# Patient Record
Sex: Female | Born: 2000
Health system: Southern US, Community
[De-identification: ages and names within clinical notes are randomized; demographics above are authoritative.]

## PROBLEM LIST (undated history)

## (undated) DIAGNOSIS — Z789 Other specified health status: Secondary | ICD-10-CM

## (undated) HISTORY — DX: Other specified health status: Z78.9

## (undated) HISTORY — PX: NO PAST SURGERIES: SHX2092

---
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
## 2018-09-09 ENCOUNTER — Other Ambulatory Visit: Payer: Self-pay | Admitting: Family Medicine

## 2018-09-09 ENCOUNTER — Other Ambulatory Visit (HOSPITAL_COMMUNITY)
Admission: RE | Admit: 2018-09-09 | Discharge: 2018-09-09 | Disposition: A | Discharge status: Home / Self Care | Payer: BLUE CROSS/BLUE SHIELD | Source: Ambulatory Visit | Attending: Family Medicine | Admitting: Family Medicine

## 2018-09-09 DIAGNOSIS — Z01411 Encounter for gynecological examination (general) (routine) with abnormal findings: Secondary | ICD-10-CM | POA: Insufficient documentation

## 2018-09-15 LAB — CYTOLOGY - PAP
Bacterial vaginitis: POSITIVE — AB
Candida vaginitis: NEGATIVE
Chlamydia: NEGATIVE
Diagnosis: NEGATIVE
Herpes: NEGATIVE
Neisseria Gonorrhea: NEGATIVE
Trichomonas: NEGATIVE

## 2020-07-01 ENCOUNTER — Ambulatory Visit (HOSPITAL_COMMUNITY)
Admission: EM | Admit: 2020-07-01 | Discharge: 2020-07-01 | Disposition: A | Discharge status: Home / Self Care | Payer: BC Managed Care – PPO | Attending: Family Medicine | Admitting: Family Medicine

## 2020-07-01 ENCOUNTER — Other Ambulatory Visit: Payer: Self-pay

## 2020-07-01 ENCOUNTER — Encounter (HOSPITAL_COMMUNITY): Payer: Self-pay

## 2020-07-01 DIAGNOSIS — Z3202 Encounter for pregnancy test, result negative: Secondary | ICD-10-CM

## 2020-07-01 DIAGNOSIS — R300 Dysuria: Secondary | ICD-10-CM | POA: Insufficient documentation

## 2020-07-01 LAB — POCT URINALYSIS DIP (DEVICE)
Bilirubin Urine: NEGATIVE
Glucose, UA: NEGATIVE mg/dL
Ketones, ur: NEGATIVE mg/dL
Nitrite: NEGATIVE
Protein, ur: NEGATIVE mg/dL
Specific Gravity, Urine: 1.02 (ref 1.005–1.030)
Urobilinogen, UA: 0.2 mg/dL (ref 0.0–1.0)
pH: 5.5 (ref 5.0–8.0)

## 2020-07-01 LAB — POC URINE PREG, ED: Preg Test, Ur: NEGATIVE

## 2020-07-01 NOTE — ED Provider Notes (Signed)
[facility]    CSN: [id number] Arrival date & time: 07/01/20  1530      History   Chief Complaint Chief Complaint  Patient presents with  . Dysuria    HPI Paige Sanchez is a 19 y.o. female.   Paige Sanchez presents with complaints of burning sensation with urination and frequency. States a few weeks ago she had some mild abdominal and back pain. This has since resolved but has been left with the burning sensation at the end of her stream. No blood in urine. Denies any vaginal symptoms. Currently menstruating. No fevers. No further back or abdominal symptoms. No gi symptoms. Denies any previous similar.    ROS per HPI, negative if not otherwise mentioned.      History reviewed. No pertinent past medical history.  There are no problems to display for this patient.   History reviewed. No pertinent surgical history.  OB History   No obstetric history on file.      Home Medications    Prior to Admission medications   Medication Sig Start Date End Date Taking? Authorizing Provider  amoxicillin (AMOXIL) 875 MG tablet Take 1 tablet (875 mg total) by mouth 2 (two) times daily. 12/22/14   [place], Arta Bruce, PA-C    Family History History reviewed. No pertinent family history.  Social History Social History   Tobacco Use  . Smoking status: Never Smoker  . Smokeless tobacco: Never Used  Substance Use Topics  . Alcohol use: No    Alcohol/week: 0.0 standard drinks  . Drug use: No     Allergies   Patient has no known allergies.   Review of Systems Review of Systems   Physical Exam Triage Vital Signs ED Triage Vitals  Enc Vitals Group     BP 07/01/20 1604 133/80     Pulse Rate 07/01/20 1604 71     Resp 07/01/20 1604 16     Temp 07/01/20 1604 98.7 F (37.1 C)     Temp Source 07/01/20 1604 Oral     SpO2 07/01/20 1604 97 %     Weight --      Height --      Head Circumference --      Peak Flow --      Pain Score 07/01/20 1606 0      Pain Loc --      Pain Edu? --      Excl. in GC? --    No data found.  Updated Vital Signs BP 133/80 (BP Location: Right Arm)   Pulse 71   Temp 98.7 F (37.1 C) (Oral)   Resp 16   LMP 07/01/2020 (Exact Date)   SpO2 97%    Physical Exam Constitutional:      General: She is not in acute distress.    Appearance: She is well-developed.  Cardiovascular:     Rate and Rhythm: Normal rate.  Pulmonary:     Effort: Pulmonary effort is normal.  Abdominal:     Palpations: Abdomen is soft. Abdomen is not rigid.     Tenderness: There is no abdominal tenderness. There is no right CVA tenderness, left CVA tenderness, guarding or rebound.  Genitourinary:    Comments: Denies sores, lesions, vaginal bleeding; no pelvic pain; gu exam deferred at this time, vaginal self swab collected.   Skin:    General: Skin is warm and dry.  Neurological:     Mental Status: She is alert and oriented to person, place, and time.  UC Treatments / Results  Labs (all labs ordered are listed, but only abnormal results are displayed) Labs Reviewed  POCT URINALYSIS DIP (DEVICE) - Abnormal; Notable for the following components:      Result Value   Hgb urine dipstick TRACE (*)    Leukocytes,Ua SMALL (*)    All other components within normal limits  URINE CULTURE  POC URINE PREG, ED  CERVICOVAGINAL ANCILLARY ONLY    EKG   Radiology No results found.  Procedures Procedures (including critical care time)  Medications Ordered in UC Medications - No data to display  Initial Impression / Assessment and Plan / UC Course  I have reviewed the triage vital signs and the nursing notes.  Pertinent labs & imaging results that were available during my care of the patient were reviewed by me and considered in my medical decision making (see chart for details).     Non toxic. Benign physical exam.  Urine with only small leuks and trace hgb- menstruating. Vaginal cytology collected and pending, urine  culture pending. Supportive cares recommended- limit caffeine, push fluids. Return precautions provided. Patient verbalized understanding and agreeable to plan.   Final Clinical Impressions(s) / UC Diagnoses   Final diagnoses:  Dysuria     Discharge Instructions     Your urine does not demonstrate an obvious urinary tract infection.  I have sent it to be cultured to confirm this.  We will also test the vagina to ensure no vaginal source of symptoms.  We will notify of you any positive findings or if any changes to treatment are needed. If normal or otherwise without concern to your results, we will not call you. Please log on to your MyChart to review your results if interested in so.   Increase your water intake.  If symptoms worsen or do not improve in the next week to return to be seen or to follow up with your PCP.     ED Prescriptions    None     PDMP not reviewed this encounter.   Georgetta Haber, NP 07/01/20 1705

## 2020-07-01 NOTE — ED Triage Notes (Signed)
Pt presents to UC for burning, and painful urination x1 week. Pt endorses lower abdominal and flank pain that is intermittent. Pt denies fever, n/v/d, chills, body aches. Pt has been treating at home with cranberry juice and azo with out relief.

## 2020-07-01 NOTE — Discharge Instructions (Signed)
Your urine does not demonstrate an obvious urinary tract infection.  I have sent it to be cultured to confirm this.  We will also test the vagina to ensure no vaginal source of symptoms.  We will notify of you any positive findings or if any changes to treatment are needed. If normal or otherwise without concern to your results, we will not call you. Please log on to your MyChart to review your results if interested in so.   Increase your water intake.  If symptoms worsen or do not improve in the next week to return to be seen or to follow up with your PCP.

## 2020-07-02 LAB — CERVICOVAGINAL ANCILLARY ONLY
Bacterial Vaginitis (gardnerella): NEGATIVE
Candida Glabrata: NEGATIVE
Candida Vaginitis: NEGATIVE
Chlamydia: NEGATIVE
Comment: NEGATIVE
Comment: NEGATIVE
Comment: NEGATIVE
Comment: NEGATIVE
Comment: NEGATIVE
Comment: NORMAL
Neisseria Gonorrhea: NEGATIVE
Trichomonas: NEGATIVE

## 2020-07-03 LAB — URINE CULTURE: Culture: >=100000 — AB

## 2020-07-04 ENCOUNTER — Telehealth (HOSPITAL_COMMUNITY): Payer: Self-pay | Admitting: Emergency Medicine

## 2020-07-04 MED ORDER — NITROFURANTOIN MONOHYD MACRO 100 MG PO CAPS
100.0000 mg | ORAL_CAPSULE | Freq: Two times a day (BID) | ORAL | 0 refills | Status: AC
Start: 2020-07-04 — End: 2020-07-09

## 2020-08-31 ENCOUNTER — Other Ambulatory Visit: Payer: Self-pay | Admitting: Critical Care Medicine

## 2020-08-31 ENCOUNTER — Other Ambulatory Visit: Payer: BC Managed Care – PPO

## 2020-08-31 DIAGNOSIS — Z20822 Contact with and (suspected) exposure to covid-19: Secondary | ICD-10-CM

## 2020-09-04 LAB — NOVEL CORONAVIRUS, NAA: SARS-CoV-2, NAA: NOT DETECTED

## 2021-08-20 ENCOUNTER — Ambulatory Visit (INDEPENDENT_AMBULATORY_CARE_PROVIDER_SITE_OTHER): Payer: BC Managed Care – PPO | Admitting: *Deleted

## 2021-08-20 ENCOUNTER — Other Ambulatory Visit: Payer: Self-pay

## 2021-08-20 VITALS — BP 124/79 | HR 81 | Temp 97.9°F | Ht 62.0 in | Wt 148.8 lb

## 2021-08-20 DIAGNOSIS — Z3400 Encounter for supervision of normal first pregnancy, unspecified trimester: Secondary | ICD-10-CM

## 2021-08-20 NOTE — Progress Notes (Signed)
   Location: [facility] Patient: clinic Provider: clinic (Sabri Teal, RN and [place], [place])  PRENATAL INTAKE SUMMARY  Ms. Paige Sanchez presents today New OB Nurse Interview.  OB History     Gravida  1   Para      Term      Preterm      AB      Living         SAB      IAB      Ectopic      Multiple      Live Births             I have reviewed the patient's medical, obstetrical, social, and family histories, medications, and available lab results.  SUBJECTIVE She has no unusual complaints.  OBJECTIVE Initial Nurse interview for history/labs (New OB)  EDD: 03/18/2022 GA: [redacted]w[redacted]d G1P0 FHT: 177  GENERAL APPEARANCE: alert, well appearing, in no apparent distress, oriented to person, place and time   ASSESSMENT Normal pregnancy  PLAN Prenatal care:  [facility] Pnl/HIV/Hep C OB Urine Culture GC/CT/PAP at next visit with Gerrit Heck, CNM HgbEval/SMA/CF (Horizon)-declined Panorama-declined A1C   Follow Up Instructions:   I discussed the assessment and treatment plan with the patient. The patient was provided an opportunity to ask questions and all were answered. The patient agreed with the plan and demonstrated an understanding of the instructions.   The patient was advised to call back or seek an in-person evaluation if the symptoms worsen or if the condition fails to improve as anticipated.  I provided 35 minutes of  face-to-face time during this encounter.  Clovis Pu, RN

## 2021-08-20 NOTE — Addendum Note (Signed)
Addended by: Clovis Pu on: 08/20/2021 11:15 AM   Modules accepted: Orders

## 2021-08-21 LAB — OBSTETRIC PANEL, INCLUDING HIV
Antibody Screen: NEGATIVE
Basophils Absolute: 0 10*3/uL (ref 0.0–0.2)
Basos: 0 %
EOS (ABSOLUTE): 0.1 10*3/uL (ref 0.0–0.4)
Eos: 1 %
HIV Screen 4th Generation wRfx: NONREACTIVE
Hematocrit: 36 % (ref 34.0–46.6)
Hemoglobin: 11.4 g/dL (ref 11.1–15.9)
Hepatitis B Surface Ag: NEGATIVE
Immature Grans (Abs): 0 10*3/uL (ref 0.0–0.1)
Immature Granulocytes: 1 %
Lymphocytes Absolute: 1.6 10*3/uL (ref 0.7–3.1)
Lymphs: 21 %
MCH: 27.3 pg (ref 26.6–33.0)
MCHC: 31.7 g/dL (ref 31.5–35.7)
MCV: 86 fL (ref 79–97)
Monocytes Absolute: 0.4 10*3/uL (ref 0.1–0.9)
Monocytes: 5 %
Neutrophils Absolute: 5.4 10*3/uL (ref 1.4–7.0)
Neutrophils: 72 %
Platelets: 283 10*3/uL (ref 150–450)
RBC: 4.18 x10E6/uL (ref 3.77–5.28)
RDW: 12.9 % (ref 11.7–15.4)
RPR Ser Ql: NONREACTIVE
Rh Factor: NEGATIVE
Rubella Antibodies, IGG: 1.89 index (ref >0.99)
WBC: 7.5 10*3/uL (ref 3.4–10.8)

## 2021-08-21 LAB — HEMOGLOBIN A1C
Est. average glucose Bld gHb Est-mCnc: 100 mg/dL
Hgb A1c MFr Bld: 5.1 % (ref 4.8–5.6)

## 2021-08-21 LAB — HEPATITIS C ANTIBODY: Hep C Virus Ab: <0.1 s/co ratio (ref 0.0–0.9)

## 2021-08-22 LAB — CULTURE, OB URINE

## 2021-08-22 LAB — URINE CULTURE, OB REFLEX: Organism ID, Bacteria: NO GROWTH

## 2021-08-30 DIAGNOSIS — O26899 Other specified pregnancy related conditions, unspecified trimester: Secondary | ICD-10-CM | POA: Insufficient documentation

## 2021-08-30 DIAGNOSIS — Z6791 Unspecified blood type, Rh negative: Secondary | ICD-10-CM | POA: Insufficient documentation

## 2021-09-26 LAB — HEPATITIS C ANTIBODY: HCV Ab: NEGATIVE

## 2021-09-27 ENCOUNTER — Encounter: Payer: BC Managed Care – PPO | Admitting: Certified Nurse Midwife

## 2021-09-30 LAB — OB RESULTS CONSOLE RUBELLA ANTIBODY, IGM: Rubella: IMMUNE

## 2021-10-01 LAB — OB RESULTS CONSOLE GC/CHLAMYDIA: Gonorrhea: NEGATIVE

## 2021-10-01 LAB — OB RESULTS CONSOLE HIV ANTIBODY (ROUTINE TESTING): HIV: NONREACTIVE

## 2021-10-06 ENCOUNTER — Other Ambulatory Visit: Payer: Self-pay

## 2021-10-06 ENCOUNTER — Inpatient Hospital Stay (HOSPITAL_COMMUNITY)
Admission: AD | Admit: 2021-10-06 | Discharge: 2021-10-06 | Disposition: A | Discharge status: Home / Self Care | Payer: BC Managed Care – PPO | Attending: Obstetrics & Gynecology | Admitting: Obstetrics & Gynecology

## 2021-10-06 DIAGNOSIS — Z3A16 16 weeks gestation of pregnancy: Secondary | ICD-10-CM | POA: Insufficient documentation

## 2021-10-06 DIAGNOSIS — O209 Hemorrhage in early pregnancy, unspecified: Secondary | ICD-10-CM | POA: Diagnosis present

## 2021-10-06 DIAGNOSIS — N930 Postcoital and contact bleeding: Secondary | ICD-10-CM

## 2021-10-06 DIAGNOSIS — O99891 Other specified diseases and conditions complicating pregnancy: Secondary | ICD-10-CM

## 2021-10-06 NOTE — MAU Note (Signed)
Pt reports to mau with c/o spotting since 0100 this morning that started after having intercourse last night.  Pt denies pain.  FHR 167

## 2021-10-06 NOTE — MAU Provider Note (Signed)
History     CSN: [id number]  Arrival date and time: 10/06/21 1322   Event Date/Time   First Provider Initiated Contact with Patient 10/06/21 1420      Chief Complaint  Patient presents with   Vaginal Bleeding   HPI Paige Sanchez is a 20 y.o. G1P0 at [redacted]w[redacted]d who presents with spotting after intercourse. She reports she is seeing a small amount of bright red bleeding when she wipes. She denies any pain or gushes of fluid. She reports no complications with the pregnancy before today.   OB History     Gravida  1   Para      Term      Preterm      AB      Living         SAB      IAB      Ectopic      Multiple      Live Births              Past Medical History:  Diagnosis Date   Medical history non-contributory     Past Surgical History:  Procedure Laterality Date   NO PAST SURGERIES      Family History  Problem Relation Age of Onset   Hypertension Father    Diabetes Paternal Grandmother     Social History   Tobacco Use   Smoking status: Never    Passive exposure: Never   Smokeless tobacco: Never  Vaping Use   Vaping Use: Never used  Substance Use Topics   Alcohol use: No    Comment: social   Drug use: No    Allergies: No Known Allergies  No medications prior to admission.    Review of Systems  Constitutional: Negative.  Negative for fatigue and fever.  HENT: Negative.    Respiratory: Negative.  Negative for shortness of breath.   Cardiovascular: Negative.  Negative for chest pain.  Gastrointestinal: Negative.  Negative for abdominal pain, constipation, diarrhea, nausea and vomiting.  Genitourinary:  Positive for vaginal bleeding. Negative for dysuria and vaginal discharge.  Neurological: Negative.  Negative for dizziness and headaches.  Physical Exam   Blood pressure 126/76, pulse 98, temperature 98.6 F (37 C), temperature source Oral, resp. rate 18, last menstrual period 06/11/2021, SpO2 100 %.  Physical Exam Vitals and  nursing note reviewed.  Constitutional:      General: She is not in acute distress.    Appearance: She is well-developed.  HENT:     Head: Normocephalic.  Eyes:     Pupils: Pupils are equal, round, and reactive to light.  Cardiovascular:     Rate and Rhythm: Normal rate and regular rhythm.     Heart sounds: Normal heart sounds.  Pulmonary:     Effort: Pulmonary effort is normal. No respiratory distress.     Breath sounds: Normal breath sounds.  Abdominal:     General: Bowel sounds are normal. There is no distension.     Palpations: Abdomen is soft.     Tenderness: There is no abdominal tenderness.  Genitourinary:    Comments: SSE: small amount of dark red blood in vault Skin:    General: Skin is warm and dry.  Neurological:     Mental Status: She is alert and oriented to person, place, and time.  Psychiatric:        Mood and Affect: Mood normal.        Behavior: Behavior normal.  Thought Content: Thought content normal.        Judgment: Judgment normal.   Cervix: closed/thick/posterior  FHT: 167 bpm  MAU Course  Procedures  MDM Fetal heart tones confirmed, minimal bleeding, cervix closed/thick Reassurance provided of normalcy of exam and normalcy of bleeding after intercourse. Discussed warning signs at length and  when to return to MAU  Assessment and Plan   1. PCB (post coital bleeding)   2. [redacted] weeks gestation of pregnancy    -Discharge home in stable condition -Bleeding precautions discussed -Patient advised to follow-up with OB as scheduled for prenatal care -Patient may return to MAU as needed or if her condition were to change or worsen   Rolm Bookbinder CNM 10/06/2021, 2:20 PM

## 2021-10-06 NOTE — Discharge Instructions (Signed)

## 2021-10-22 ENCOUNTER — Ambulatory Visit: Payer: BC Managed Care – PPO

## 2021-12-22 NOTE — L&D Delivery Note (Addendum)
Delivery Note ? ? ?Patient Name: Paige Sanchez ?DOB: 03/19/2001 ?MRN: [id number] ? ?Date of admission: 03/07/2022 ?Delivering MD: Oliver Hum, Katelan Hirt  ?Date of delivery: 03/08/22 ?Type of delivery: SVD ? ?Newborn Data: ?Live born female  ?Birth Weight:   ?APGAR: 9, 10 ? ?Newborn Delivery   ?Birth date/time: 03/08/2022 16:02:00 ?Delivery type: Vaginal, Spontaneous ?  ?  ?Paige Sanchez, 21 y.o., @ [redacted]w[redacted]d,  G1P0, who was admitted for SROM in early labor, progress with pitocin and SROM>24 hours. I was called to the room when she progressed 2+ station in the second stage of labor.  She pushed for 20/min.  She delivered a viable infant, cephalic and restituted to the ROA position over an intact perineum.  A loose nuchal cord   was identified, and reduced. The baby was placed on maternal abdomen while initial step of NRP were perfmored (Dry, Stimulated, and warmed). Hat placed on baby for thermoregulation. Delayed cord clamping was performed for 3 minutes.  Cord double clamped and cut.  Cord cut by FOB. Apgar scores were 9 and 10. Prophylactic Pitocin was started in the third stage of labor for active management. The placenta delivered spontaneously, shultz, with a 3 vessel cord and was sent to LD.  Inspection revealed 2nd degree and right labial. An examination of the vaginal vault and cervix was free from lacerations. The uterus was firm, bleeding stable.  The repair was done under epidural and lidocaine.   Placenta and umbilical artery blood gas were not sent.  There were no complications during the procedure.  Mom and baby skin to skin following delivery. Left in stable condition. Pt denies HA, RUQ pain or vision changes. Will recheck CBC and CMP in the morning. Started on procardia 30mg  XL daily,. Will increase tomorrow if BP still elevated.  ?BP (!) 146/80   Pulse (!) 108   Temp 98.1 ?F (36.7 ?C) (Oral)   Resp 18   Ht 5\' 2"  (1.575 m)   Wt 84.4 kg   LMP 06/11/2021   SpO2 100%   BMI 34.02 kg/m?  ? ? ?Maternal  Info: ?Anesthesia: Epidural ?Episiotomy: no ?Lacerations:  2nd degree repaired and right labial not repaired: hemostatic ?Suture Repair: 3.0 CT vicryl   ?Est. Blood Loss (mL):  ? ?Newborn Info: ? ?Baby Sex: female ?Circumcision: in pt desired ?APGAR (1 MIN): 9   ?APGAR (5 MINS): 10   ?APGAR (10 MINS):   ? ? ?Mom to postpartum.  Baby to Couplet care / Skin to Skin. ? ?DR 06/13/2021 on birth  ? ?[facility], Renato Battles, NP-C ?03/08/22 ?4:46 PM ? ? ? ?  ?

## 2022-02-24 LAB — OB RESULTS CONSOLE GBS: GBS: NEGATIVE

## 2022-03-07 ENCOUNTER — Inpatient Hospital Stay (HOSPITAL_COMMUNITY)
Admission: AD | Admit: 2022-03-07 | Discharge: 2022-03-10 | DRG: 807 | Disposition: A | Discharge status: Home / Self Care | Payer: BC Managed Care – PPO | Attending: Obstetrics and Gynecology | Admitting: Obstetrics and Gynecology

## 2022-03-07 ENCOUNTER — Other Ambulatory Visit: Payer: Self-pay

## 2022-03-07 ENCOUNTER — Encounter (HOSPITAL_COMMUNITY): Payer: Self-pay | Admitting: Obstetrics and Gynecology

## 2022-03-07 DIAGNOSIS — O26899 Other specified pregnancy related conditions, unspecified trimester: Secondary | ICD-10-CM

## 2022-03-07 DIAGNOSIS — Z6791 Unspecified blood type, Rh negative: Secondary | ICD-10-CM | POA: Diagnosis not present

## 2022-03-07 DIAGNOSIS — Z3A38 38 weeks gestation of pregnancy: Secondary | ICD-10-CM | POA: Diagnosis not present

## 2022-03-07 DIAGNOSIS — O134 Gestational [pregnancy-induced] hypertension without significant proteinuria, complicating childbirth: Secondary | ICD-10-CM | POA: Diagnosis present

## 2022-03-07 DIAGNOSIS — O139 Gestational [pregnancy-induced] hypertension without significant proteinuria, unspecified trimester: Secondary | ICD-10-CM | POA: Diagnosis not present

## 2022-03-07 DIAGNOSIS — O4292 Full-term premature rupture of membranes, unspecified as to length of time between rupture and onset of labor: Principal | ICD-10-CM | POA: Diagnosis present

## 2022-03-07 DIAGNOSIS — Z3400 Encounter for supervision of normal first pregnancy, unspecified trimester: Principal | ICD-10-CM

## 2022-03-07 DIAGNOSIS — O26893 Other specified pregnancy related conditions, third trimester: Secondary | ICD-10-CM | POA: Diagnosis present

## 2022-03-07 LAB — COMPREHENSIVE METABOLIC PANEL
ALT: 12 U/L (ref 0–44)
AST: 25 U/L (ref 15–41)
Albumin: 3.4 g/dL — ABNORMAL LOW (ref 3.5–5.0)
Alkaline Phosphatase: 167 U/L — ABNORMAL HIGH (ref 38–126)
Anion gap: 10 (ref 5–15)
BUN: 5 mg/dL — ABNORMAL LOW (ref 6–20)
CO2: 19 mmol/L — ABNORMAL LOW (ref 22–32)
Calcium: 9.4 mg/dL (ref 8.9–10.3)
Chloride: 107 mmol/L (ref 98–111)
Creatinine, Ser: 0.5 mg/dL (ref 0.44–1.00)
GFR, Estimated: >60 mL/min (ref >60)
Glucose, Bld: 66 mg/dL — ABNORMAL LOW (ref 70–99)
Potassium: 4.3 mmol/L (ref 3.5–5.1)
Sodium: 136 mmol/L (ref 135–145)
Total Bilirubin: 0.8 mg/dL (ref 0.3–1.2)
Total Protein: 6.9 g/dL (ref 6.5–8.1)

## 2022-03-07 LAB — CBC
HCT: 36.6 % (ref 36.0–46.0)
Hemoglobin: 11.6 g/dL — ABNORMAL LOW (ref 12.0–15.0)
MCH: 28.1 pg (ref 26.0–34.0)
MCHC: 31.7 g/dL (ref 30.0–36.0)
MCV: 88.6 fL (ref 80.0–100.0)
Platelets: 237 10*3/uL (ref 150–400)
RBC: 4.13 MIL/uL (ref 3.87–5.11)
RDW: 15 % (ref 11.5–15.5)
WBC: 10.9 10*3/uL — ABNORMAL HIGH (ref 4.0–10.5)
nRBC: 0 % (ref 0.0–0.2)

## 2022-03-07 LAB — TYPE AND SCREEN
ABO/RH(D): O NEG
Antibody Screen: POSITIVE

## 2022-03-07 LAB — PROTEIN / CREATININE RATIO, URINE
Creatinine, Urine: 72.33 mg/dL
Protein Creatinine Ratio: 0.24 mg/mg{Cre} — ABNORMAL HIGH (ref 0.00–0.15)
Total Protein, Urine: 17 mg/dL

## 2022-03-07 LAB — POCT FERN TEST: POCT Fern Test: POSITIVE

## 2022-03-07 MED ORDER — OXYCODONE-ACETAMINOPHEN 5-325 MG PO TABS: 2.0000 | ORAL_TABLET | ORAL | Status: DC | PRN

## 2022-03-07 MED ORDER — OXYTOCIN-SODIUM CHLORIDE 30-0.9 UT/500ML-% IV SOLN
2.5000 [IU]/h | INTRAVENOUS | Status: DC
  Administered 2022-03-08: 2.5 [IU]/h via INTRAVENOUS

## 2022-03-07 MED ORDER — SOD CITRATE-CITRIC ACID 500-334 MG/5ML PO SOLN: 30.0000 mL | ORAL | Status: DC | PRN

## 2022-03-07 MED ORDER — LACTATED RINGERS IV SOLN: INTRAVENOUS | Status: DC

## 2022-03-07 MED ORDER — OXYTOCIN-SODIUM CHLORIDE 30-0.9 UT/500ML-% IV SOLN
1.0000 m[IU]/min | INTRAVENOUS | Status: DC
  Administered 2022-03-08: 2 m[IU]/min via INTRAVENOUS
  Filled 2022-03-07: qty 500

## 2022-03-07 MED ORDER — OXYCODONE-ACETAMINOPHEN 5-325 MG PO TABS: 1.0000 | ORAL_TABLET | ORAL | Status: DC | PRN

## 2022-03-07 MED ORDER — LACTATED RINGERS IV SOLN
500.0000 mL | INTRAVENOUS | Status: DC | PRN
  Administered 2022-03-08: 500 mL via INTRAVENOUS

## 2022-03-07 MED ORDER — ONDANSETRON HCL 4 MG/2ML IJ SOLN: 4.0000 mg | Freq: Four times a day (QID) | INTRAMUSCULAR | Status: DC | PRN

## 2022-03-07 MED ORDER — LIDOCAINE HCL (PF) 1 % IJ SOLN
30.0000 mL | INTRAMUSCULAR | Status: AC | PRN
  Administered 2022-03-08: 30 mL via SUBCUTANEOUS
  Filled 2022-03-07: qty 30

## 2022-03-07 MED ORDER — TERBUTALINE SULFATE 1 MG/ML IJ SOLN: 0.2500 mg | Freq: Once | INTRAMUSCULAR | Status: DC | PRN

## 2022-03-07 MED ORDER — FENTANYL CITRATE (PF) 100 MCG/2ML IJ SOLN
50.0000 ug | INTRAMUSCULAR | Status: DC | PRN
  Administered 2022-03-08 (×2): 50 ug via INTRAVENOUS
  Filled 2022-03-07 (×2): qty 2

## 2022-03-07 MED ORDER — ACETAMINOPHEN 325 MG PO TABS: 650.0000 mg | ORAL_TABLET | ORAL | Status: DC | PRN

## 2022-03-07 MED ORDER — OXYTOCIN BOLUS FROM INFUSION
333.0000 mL | Freq: Once | INTRAVENOUS | Status: AC
  Administered 2022-03-08: 333 mL via INTRAVENOUS

## 2022-03-07 NOTE — Progress Notes (Addendum)
Labor Progress Note ? ?Paige Sanchez is a 21 y.o. female, G1P0000, IUP at 38.3 weeks, presenting for Early labor with SROM @1330  on 3/17. RH-, rhogam given 12/27/2021. Pt endorse + FM. Denies vaginal bleeding.  ? ?Subjective: ?Pt resting well, still feeling cxt. Tolerates well, support at bedside. POC reviewed pt desires expectant management, but willing to start pitocin at MN if no cervical change. Reviewed R/B/A of pitocin.  ?Patient Active Problem List  ? Diagnosis Date Noted  ? Normal labor and delivery 03/07/2022  ? Rh negative, antepartum 08/30/2021  ? Supervision of normal first pregnancy, antepartum 08/20/2021  ? ?Objective: ?BP 138/78   Pulse (!) 105   Temp 98.7 ?F (37.1 ?C)   Resp 18   LMP 06/11/2021  ?No intake/output data recorded. ?No intake/output data recorded. ?NST: FHR baseline 145 bpm, Variability: moderate, Accelerations:present, Decelerations:  Absent= Cat 1/Reactive ?CTX:  irregular, every 2-4 minutes, lasting 70-80 seconds. ?Uterus gravid, soft non tender, moderate to palpate with contractions.  ?SVE:  Dilation: 2 ?Effacement (%): 80 ?Station: -2 ?Exam by:: Aron Baba RN ?Pitocin at 0 mUn/min ? ?Assessment:  ?Paige Sanchez is a 21 y.o. female, G1P0000, IUP at 38.3 weeks, presenting for Early labor with SROM @1330  on 3/17. RH-, rhogam given 12/27/2021. Pt endorse + FM. Denies vaginal bleeding. Progressing naturally in latent labor.  ?Patient Active Problem List  ? Diagnosis Date Noted  ? Normal labor and delivery 03/07/2022  ? Rh negative, antepartum 08/30/2021  ? Supervision of normal first pregnancy, antepartum 08/20/2021  ? ?NICHD: Category 1 ? ?Membranes:  SROM, clear @ 1330 on 3/17, no s/s of infection ? ?Induction:   ? Cytotec xN/A, pt 2cm and 80% upon admission  ? Foley Bulb: N/A pt SROM ? Pitocin - 0 ? ?Pain management:   ?            IV pain management: x PRN fentanyl ? Nitrous:  PRN ?            Epidural placement: PRN ? ?GBS Negative ? ? ?Plan: ?Continue labor  plan ?Continuous/intermittent monitoring ?Rest ?Ambulate ?Frequent position changes to facilitate fetal rotation and descent. ?Will reassess with cervical exam at 4 hours or earlier if necessary ?Expectant management.  ?Plan to start pitocin per protocol if labor does not progress ?Anticipate labor progression and vaginal delivery.  ? ?Noralyn Pick, NP-C, CNM, MSN ?03/07/2022. 8:30 PM  ?

## 2022-03-07 NOTE — H&P (Signed)
Paige Sanchez is a 21 y.o. female, G1P0000, IUP at 38.3 weeks, presenting for Early labor with SROM @1330  on 3/17. RH-, rhogam given 12/27/2021. Pt endorse + FM. Denies vaginal bleeding.  ? ?Patient Active Problem List  ? Diagnosis Date Noted  ? Normal labor and delivery 03/07/2022  ? Rh negative, antepartum 08/30/2021  ? Supervision of normal first pregnancy, antepartum 08/20/2021  ?  ? ?Active Ambulatory Problems  ?  Diagnosis Date Noted  ? Supervision of normal first pregnancy, antepartum 08/20/2021  ? Rh negative, antepartum 08/30/2021  ? ?Resolved Ambulatory Problems  ?  Diagnosis Date Noted  ? No Resolved Ambulatory Problems  ? ?Past Medical History:  ?Diagnosis Date  ? Medical history non-contributory   ?  ? ? ?Medications Prior to Admission  ?Medication Sig Dispense Refill Last Dose  ? calcium-vitamin D (OSCAL WITH D) 500-5 MG-MCG tablet Take 1 tablet by mouth.   03/06/2022  ? ferrous sulfate 325 (65 FE) MG tablet Take 325 mg by mouth daily with breakfast.   03/06/2022  ? Prenatal Vit-Fe Fumarate-FA (PRENATAL MULTIVITAMIN) TABS tablet Take 1 tablet by mouth daily at 12 noon.   03/06/2022  ? ? ?Past Medical History:  ?Diagnosis Date  ? Medical history non-contributory   ?  ? ?No current facility-administered medications on file prior to encounter.  ? ?Current Outpatient Medications on File Prior to Encounter  ?Medication Sig Dispense Refill  ? calcium-vitamin D (OSCAL WITH D) 500-5 MG-MCG tablet Take 1 tablet by mouth.    ? ferrous sulfate 325 (65 FE) MG tablet Take 325 mg by mouth daily with breakfast.    ? Prenatal Vit-Fe Fumarate-FA (PRENATAL MULTIVITAMIN) TABS tablet Take 1 tablet by mouth daily at 12 noon.    ?  ? ?No Known Allergies ? ?History of present pregnancy: ?Pt Info/Preference:  Screening/Consents:  Labs:   ?EDD: Estimated Date of Delivery: 03/18/22  ?Establised: Patient's last menstrual period was 06/11/2021.  Anatomy Scan: Date: 11/30 ?Placenta Location: anterior and complete Genetic Screen:  [place]:LR ?AFP: Neg ?First Tri: ?Quad: ?Horizon: Neg  ?Office: [facility]            First PNV: 11.1 Blood Type --/--/O NEG (03/17 1615)  ?Language:  english Last PNV: 37.6 weeks Rhogam    ?Flu Vaccine:  UTD   Antibody PENDING (03/17 1615)  ?TDaP vaccine UTD   GTT: Early: 5.0 ?Third Trimester: 15  ?Feeding Plan: Breast BTL: no Rubella: 1.89 (08/30 0913)  ?Contraception: ??? VBAC: no RPR: Non Reactive (08/30 0913)   ?Circumcision: ???   HBsAg: Negative (08/30 0913)/HCV negative   ?Pediatrician:  ???   HIV: Non Reactive (08/30 0913)   ?Prenatal Classes: no Additional 06-13-2001: no GBS: Negative/-- (03/06 0000)(For PCN allergy, check sensitivities)   ?    Chlamydia: neg  ?  MFM Referral/Consult:  GC: neg  ?Support Person: partner   PAP: ????  ?Pain Management: Epidural vs natural Neonatologist Referral:  Hgb Electrophoresis:  AA  ?Birth Plan: [facility]   Hgb NOB: 11.5    28W: 10.3  ? ?OB History   ? ? Gravida  ?1  ? Para  ?   ? Term  ?   ? Preterm  ?   ? AB  ?   ? Living  ?   ?  ? ? SAB  ?   ? IAB  ?   ? Ectopic  ?   ? Multiple  ?   ? Live Births  ?   ?   ?  ?  ? ?  Past Medical History:  ?Diagnosis Date  ? Medical history non-contributory   ? ?Past Surgical History:  ?Procedure Laterality Date  ? NO PAST SURGERIES    ? ?Family History: family history includes Diabetes in her paternal grandmother; Hypertension in her father. ?Social History:  reports that she has never smoked. She has never been exposed to tobacco smoke. She has never used smokeless tobacco. She reports that she does not drink alcohol and does not use drugs. ? ? ?Prenatal Transfer Tool  ?Maternal Diabetes: No ?Genetic Screening: Normal ?Maternal Ultrasounds/Referrals: Normal ?Fetal Ultrasounds or other Referrals:  None ?Maternal Substance Abuse:  No ?Significant Maternal Medications:  None ?Significant Maternal Lab Results: Group B Strep negative ? ?ROS:  ?Review of Systems  ?Constitutional: Negative.   ?HENT: Negative.    ?Eyes: Negative.   ?Respiratory: Negative.     ?Cardiovascular: Negative.   ?Gastrointestinal: Negative.   ?Genitourinary: Negative.   ?     Vaginal leakage  ?Musculoskeletal: Negative.   ?Skin: Negative.   ?Neurological: Negative.   ?Endo/Heme/Allergies: Negative.   ?Psychiatric/Behavioral: Negative.     ? ?Physical Exam: ?BP 138/78   Pulse (!) 105   Temp 98.7 ?F (37.1 ?C)   Resp 18   LMP 06/11/2021   ?Physical Exam ?Vitals and nursing note reviewed.  ?Constitutional:   ?   Appearance: Normal appearance.  ?HENT:  ?   Head: Normocephalic and atraumatic.  ?   Nose: Nose normal.  ?   Mouth/Throat:  ?   Mouth: Mucous membranes are moist.  ?Eyes:  ?   Conjunctiva/sclera: Conjunctivae normal.  ?Cardiovascular:  ?   Rate and Rhythm: Normal rate and regular rhythm.  ?   Pulses: Normal pulses.  ?   Heart sounds: Normal heart sounds.  ?Pulmonary:  ?   Effort: Pulmonary effort is normal.  ?   Breath sounds: Normal breath sounds.  ?Abdominal:  ?   General: Bowel sounds are normal.  ?Genitourinary: ?   Comments: Uteurs gravida equal to dates, pelvis adequate for vaginal delivery.  ?Musculoskeletal:     ?   General: Normal range of motion.  ?   Cervical back: Normal range of motion and neck supple.  ?Skin: ?   General: Skin is warm.  ?   Capillary Refill: Capillary refill takes less than 2 seconds.  ?Neurological:  ?   General: No focal deficit present.  ?   Mental Status: She is alert.  ?Psychiatric:     ?   Mood and Affect: Mood normal.  ?  ? ?NST: FHR baseline 145 bpm, Variability: moderate, Accelerations:present, Decelerations:  Absent= Cat 1/Reactive ?UC:   irregular, every 2-5 minutes, lasting 70-80 seconds long.  ?SVE:   Dilation: 2 ?Effacement (%): 80 ?Station: -2 ?Exam by:: S moyer RN, vertex verified by fetal sutures.  ?Leopold's: Position vertex, EFW 7lbs via leopold's.  ?Bedside US vertex in MAU.  ? ?Labs: ?Results for orders placed or performed during the hospital encounter of 03/07/22 (from the past 24 hour(s))  ?POCT fern test     Status: Abnormal  ?  Collection Time: 03/07/22  3:46 PM  ?Result Value Ref Range  ? POCT Fern Test Positive = ruptured amniotic membanes   ?CBC     Status: Abnormal  ? Collection Time: 03/07/22  4:15 PM  ?Result Value Ref Range  ? WBC 10.9 (H) 4.0 - 10.5 K/uL  ? RBC 4.13 3.87 - 5.11 MIL/uL  ? Hemoglobin 11.6 (L) 12.0 - 15.0 g/dL  ? HCT  36.6 36.0 - 46.0 %  ? MCV 88.6 80.0 - 100.0 fL  ? MCH 28.1 26.0 - 34.0 pg  ? MCHC 31.7 30.0 - 36.0 g/dL  ? RDW 15.0 11.5 - 15.5 %  ? Platelets 237 150 - 400 K/uL  ? nRBC 0.0 0.0 - 0.2 %  ?Type and screen [facility]     Status: None (Preliminary result)  ? Collection Time: 03/07/22  4:15 PM  ?Result Value Ref Range  ? ABO/RH(D) O NEG   ? Antibody Screen PENDING   ? Sample Expiration    ?  03/10/2022,2359 ?Performed at [facility], [address], [place], [place] [address] ?  ? ? ?Imaging:  ?No results found. ? ?MAU Course: ?Orders Placed This Encounter  ?Procedures  ? OB RESULT CONSOLE Group B Strep  ? CBC  ? RPR  ? Diet clear liquid Room service appropriate? Yes; Fluid consistency: Thin  ? Contraction - monitoring  ? External fetal heart monitoring  ? Vaginal exam  ? Vitals signs per unit policy  ? Notify physician (specify)  ? Fetal monitoring per unit policy  ? Activity as tolerated  ? Cervical Exam  ? Measure blood pressure post delivery every 15 min x 1 hour then every 30 min x 1 hour  ? Fundal check post delivery every 15 min x 1 hour then every 30 min x 1 hour  ? If Rapid HIV test positive or known HIV positive: initiate AZT orders  ? May in and out cath x 2 for inability to void  ? Insert urethral catheter X 1 PRN If Coude Catheter is chosen, qualified resources by campus can be found in the clinical skills nursing procedure for Coude Catheter 1. If straight catheterized > 2 times or patient unable to void post epidural plac...  ? Refer to Sidebar Report Urinary (Foley) Catheter Indications  ? Refer to Sidebar Report Post Indwelling Urinary Catheter Removal and  Intervention Guidelines  ? Discontinue foley prior to vaginal delivery  ? Initiate Carrier Fluid Protocol  ? Initiate Oral Care Protocol  ? Patient may have epidural placement upon request  ? Full code  ? Nitrous Oxide 50%/Oxyge

## 2022-03-07 NOTE — Progress Notes (Signed)
Pt informed that the ultrasound is considered a limited OB ultrasound and is not intended to be a complete ultrasound exam.  Patient also informed that the ultrasound is not being completed with the intent of assessing for fetal or placental anomalies or any pelvic abnormalities.  Explained that the purpose of today?s ultrasound is to assess for  presentation and viability.  Patient acknowledges the purpose of the exam and the limitations of the study.    ? ?Presentation: VERTEX  ? ?Paige Clan, DO  ?

## 2022-03-07 NOTE — MAU Note (Signed)
.  Paige Sanchez is a 22 y.o. at [redacted]w[redacted]d here in MAU reporting: that around 1:30 she had a gush of fluid and has continued to leak sine. Good fetal movement felt and feeling some pelvic pressure and tightening  ? ?Onset of complaint: 1330 ?Pain score: 2/10 ?Vitals:  ? 03/07/22 1523  ?BP: 138/74  ?Pulse: 88  ?Resp: 18  ?Temp: 98.7 ?F (37.1 ?C)  ?   ?FHT:155 ?Lab orders placed from triage:  . ?

## 2022-03-08 ENCOUNTER — Inpatient Hospital Stay (HOSPITAL_COMMUNITY): Payer: BC Managed Care – PPO | Admitting: Anesthesiology

## 2022-03-08 ENCOUNTER — Encounter (HOSPITAL_COMMUNITY): Payer: Self-pay | Admitting: Obstetrics and Gynecology

## 2022-03-08 DIAGNOSIS — O139 Gestational [pregnancy-induced] hypertension without significant proteinuria, unspecified trimester: Secondary | ICD-10-CM | POA: Diagnosis not present

## 2022-03-08 LAB — RPR: RPR Ser Ql: NONREACTIVE

## 2022-03-08 MED ORDER — DIBUCAINE (PERIANAL) 1 % EX OINT: 1.0000 "application " | TOPICAL_OINTMENT | CUTANEOUS | Status: DC | PRN

## 2022-03-08 MED ORDER — BENZOCAINE-MENTHOL 20-0.5 % EX AERO: 1.0000 "application " | INHALATION_SPRAY | CUTANEOUS | Status: DC | PRN

## 2022-03-08 MED ORDER — COCONUT OIL OIL: 1.0000 "application " | TOPICAL_OIL | Status: DC | PRN

## 2022-03-08 MED ORDER — IBUPROFEN 600 MG PO TABS
600.0000 mg | ORAL_TABLET | Freq: Four times a day (QID) | ORAL | Status: DC
  Administered 2022-03-08 – 2022-03-10 (×7): 600 mg via ORAL
  Filled 2022-03-08 (×7): qty 1

## 2022-03-08 MED ORDER — SIMETHICONE 80 MG PO CHEW: 80.0000 mg | CHEWABLE_TABLET | ORAL | Status: DC | PRN

## 2022-03-08 MED ORDER — LACTATED RINGERS IV SOLN
500.0000 mL | Freq: Once | INTRAVENOUS | Status: AC
  Administered 2022-03-08: 500 mL via INTRAVENOUS

## 2022-03-08 MED ORDER — ONDANSETRON HCL 4 MG/2ML IJ SOLN: 4.0000 mg | INTRAMUSCULAR | Status: DC | PRN

## 2022-03-08 MED ORDER — ZOLPIDEM TARTRATE 5 MG PO TABS: 5.0000 mg | ORAL_TABLET | Freq: Every evening | ORAL | Status: DC | PRN

## 2022-03-08 MED ORDER — PRENATAL MULTIVITAMIN CH
1.0000 | ORAL_TABLET | Freq: Every day | ORAL | Status: DC
  Administered 2022-03-09 – 2022-03-10 (×2): 1 via ORAL
  Filled 2022-03-08 (×2): qty 1

## 2022-03-08 MED ORDER — WITCH HAZEL-GLYCERIN EX PADS: 1.0000 "application " | MEDICATED_PAD | CUTANEOUS | Status: DC | PRN

## 2022-03-08 MED ORDER — TETANUS-DIPHTH-ACELL PERTUSSIS 5-2.5-18.5 LF-MCG/0.5 IM SUSY: 0.5000 mL | PREFILLED_SYRINGE | Freq: Once | INTRAMUSCULAR | Status: DC

## 2022-03-08 MED ORDER — PHENYLEPHRINE 40 MCG/ML (10ML) SYRINGE FOR IV PUSH (FOR BLOOD PRESSURE SUPPORT): 80.0000 ug | PREFILLED_SYRINGE | INTRAVENOUS | Status: DC | PRN

## 2022-03-08 MED ORDER — PHENYLEPHRINE 40 MCG/ML (10ML) SYRINGE FOR IV PUSH (FOR BLOOD PRESSURE SUPPORT)
80.0000 ug | PREFILLED_SYRINGE | INTRAVENOUS | Status: DC | PRN
  Filled 2022-03-08: qty 10

## 2022-03-08 MED ORDER — ACETAMINOPHEN 325 MG PO TABS: 650.0000 mg | ORAL_TABLET | ORAL | Status: DC | PRN

## 2022-03-08 MED ORDER — LIDOCAINE HCL (PF) 1 % IJ SOLN
INTRAMUSCULAR | Status: DC | PRN
  Administered 2022-03-08: 4 mL via EPIDURAL
  Administered 2022-03-08: 6 mL via EPIDURAL

## 2022-03-08 MED ORDER — EPHEDRINE 5 MG/ML INJ: 10.0000 mg | INTRAVENOUS | Status: DC | PRN

## 2022-03-08 MED ORDER — NIFEDIPINE ER OSMOTIC RELEASE 30 MG PO TB24
30.0000 mg | ORAL_TABLET | Freq: Every day | ORAL | Status: DC
  Administered 2022-03-08 – 2022-03-10 (×3): 30 mg via ORAL
  Filled 2022-03-08 (×3): qty 1

## 2022-03-08 MED ORDER — DIPHENHYDRAMINE HCL 25 MG PO CAPS: 25.0000 mg | ORAL_CAPSULE | Freq: Four times a day (QID) | ORAL | Status: DC | PRN

## 2022-03-08 MED ORDER — ONDANSETRON HCL 4 MG PO TABS: 4.0000 mg | ORAL_TABLET | ORAL | Status: DC | PRN

## 2022-03-08 MED ORDER — FENTANYL-BUPIVACAINE-NACL 0.5-0.125-0.9 MG/250ML-% EP SOLN
12.0000 mL/h | EPIDURAL | Status: DC | PRN
  Administered 2022-03-08: 12 mL/h via EPIDURAL
  Filled 2022-03-08: qty 250

## 2022-03-08 MED ORDER — RHO D IMMUNE GLOBULIN 1500 UNIT/2ML IJ SOSY
300.0000 ug | PREFILLED_SYRINGE | Freq: Once | INTRAMUSCULAR | Status: AC
  Administered 2022-03-09: 300 ug via INTRAVENOUS
  Filled 2022-03-08: qty 2

## 2022-03-08 MED ORDER — SENNOSIDES-DOCUSATE SODIUM 8.6-50 MG PO TABS
2.0000 | ORAL_TABLET | Freq: Every day | ORAL | Status: DC
  Administered 2022-03-09 – 2022-03-10 (×2): 2 via ORAL
  Filled 2022-03-08 (×2): qty 2

## 2022-03-08 MED ORDER — DIPHENHYDRAMINE HCL 50 MG/ML IJ SOLN: 12.5000 mg | INTRAMUSCULAR | Status: DC | PRN

## 2022-03-08 NOTE — Anesthesia Preprocedure Evaluation (Signed)
Anesthesia Evaluation  Patient identified by MRN, date of birth, ID band Patient awake    Reviewed: Allergy & Precautions, H&P , NPO status , Patient's Chart, lab work & pertinent test results  History of Anesthesia Complications Negative for: history of anesthetic complications  Airway Mallampati: II  TM Distance: >3 FB     Dental   Pulmonary neg pulmonary ROS,    Pulmonary exam normal        Cardiovascular hypertension (PIH),  Rhythm:regular Rate:Normal     Neuro/Psych negative neurological ROS  negative psych ROS   GI/Hepatic negative GI ROS, Neg liver ROS,   Endo/Other  negative endocrine ROS  Renal/GU negative Renal ROS  negative genitourinary   Musculoskeletal   Abdominal   Peds  Hematology negative hematology ROS (+)   Anesthesia Other Findings   Reproductive/Obstetrics (+) Pregnancy                             Anesthesia Physical Anesthesia Plan  ASA: 2  Anesthesia Plan: Epidural   Post-op Pain Management:    Induction:   PONV Risk Score and Plan:   Airway Management Planned:   Additional Equipment:   Intra-op Plan:   Post-operative Plan:   Informed Consent: I have reviewed the patients History and Physical, chart, labs and discussed the procedure including the risks, benefits and alternatives for the proposed anesthesia with the patient or authorized representative who has indicated his/her understanding and acceptance.       Plan Discussed with:   Anesthesia Plan Comments:         Anesthesia Quick Evaluation  

## 2022-03-08 NOTE — Progress Notes (Signed)
Labor Progress Note ? ?Paige Sanchez is a 21 y.o. female, G1P0000, IUP at 38.3 weeks, presenting for Early labor with SROM @1330  on 3/17. RH-, rhogam given 12/27/2021.  with new onset GHTN, Pt endorse + FM. Denies vaginal bleeding.  ? ?Subjective: ?Pt still feeling cxt more intense now, and requested epidural, pt now comfortable post epidural, support at bedside, denies HA, RUQ pain or vision changes.  ?Patient Active Problem List  ? Diagnosis Date Noted  ? Gestational hypertension 03/08/2022  ? Normal labor and delivery 03/07/2022  ? Rh negative, antepartum 08/30/2021  ? Supervision of normal first pregnancy, antepartum 08/20/2021  ? ?Objective: ?BP 136/68   Pulse 95   Temp 98.3 ?F (36.8 ?C) (Oral)   Resp 16   Ht 5\' 2"  (1.575 m)   Wt 84.4 kg   LMP 06/11/2021   SpO2 99%   BMI 34.02 kg/m?  ?No intake/output data recorded. ?No intake/output data recorded. ?NST: FHR baseline 145 bpm, Variability: moderate, Accelerations:present, Decelerations:  Absent= Cat 1/Reactive ?CTX:  irregular, every 2-3 minutes, lasting 70-80 seconds. ?Uterus gravid, soft non tender, moderate to palpate with contractions.  ?SVE:  Dilation: 6 ?Effacement (%): 80 ?Station: -1 ?Exam by:: 06/13/2021 RN ?Pitocin at 12 mUn/min ? ?Assessment:  ?Paige Sanchez is a 21 y.o. female, G1P0000, IUP at 38.3 weeks, presenting for Early labor with SROM @1330  on 3/17. RH-, rhogam given 12/27/2021. With new onset GHTN. Pt endorse + FM. Denies vaginal bleeding. Progressing on pitocin in active labor. ?Patient Active Problem List  ? Diagnosis Date Noted  ? Gestational hypertension 03/08/2022  ? Normal labor and delivery 03/07/2022  ? Rh negative, antepartum 08/30/2021  ? Supervision of normal first pregnancy, antepartum 08/20/2021  ? ?NICHD: Category 1 ? ?Membranes:  SROM, clear @ 1330 on 3/17, no s/s of infection ? ?Induction:   ? Cytotec xN/A, pt 2cm and 80% upon admission  ? Foley Bulb: N/A pt SROM ? Pitocin - 12 ? ?Pain management:   ?             IV pain management: x fentanyl @ 0530, 0548 ? Nitrous:  PRN ?            Epidural placement: Placed at 3/18 @ 0815 ? ?GBS Negative ? ?GHTN: BP 131/77, asymptomatic, PCR 0.24, cmp unremarkable.  ? ? ?Plan: ?Continue labor plan ?Continuous monitoring ?Rest ?Frequent position changes to facilitate fetal rotation and descent. ?Will reassess with cervical exam at 4 hours or earlier if necessary ?Continue pitocin per protocol  ?Anticipate labor progression and vaginal delivery.  ? ?08/22/2021, NP-C, CNM, MSN ?03/08/2022. 6:30 AM  ?

## 2022-03-08 NOTE — Anesthesia Procedure Notes (Signed)
Epidural ?Patient location during procedure: OB ?Start time: 03/08/2022 8:12 AM ?End time: 03/08/2022 8:34 AM ? ?Staffing ?Anesthesiologist: Lidia Collum, MD ?Performed: anesthesiologist  ? ?Preanesthetic Checklist ?Completed: patient identified, IV checked, risks and benefits discussed, monitors and equipment checked, pre-op evaluation and timeout performed ? ?Epidural ?Patient position: sitting ?Prep: DuraPrep ?Patient monitoring: heart rate, continuous pulse ox and blood pressure ?Approach: midline ?Location: L3-L4 ?Injection technique: LOR air ? ?Needle:  ?Needle type: Tuohy  ?Needle gauge: 17 G ?Needle length: 9 cm ?Needle insertion depth: 6 cm ?Catheter type: closed end flexible ?Catheter size: 19 Gauge ?Catheter at skin depth: 11 cm ?Test dose: negative ? ?Assessment ?Events: blood not aspirated, injection not painful, no injection resistance, paresthesia and negative IV test ? ?Additional Notes ?Multiple attempts required. Loss was ultimately much further to the right than the perceived midline. Pt still had brief paresthesia on the left with LOR, but given the difficulty in placement I elected to thread the catheter which threaded easily with no additional paresthesia. Patient counseled on possibility of one-sided block and told to advise [place] if she has uncontrolled pain.Reason for block:procedure for pain ? ? ? ?

## 2022-03-08 NOTE — Progress Notes (Signed)
Labor Progress Note ? ?Paige Sanchez is a 21 y.o. female, G1P0000, IUP at 38.3 weeks, presenting for Early labor with SROM @1330  on 3/17. RH-, rhogam given 12/27/2021.  with new onset GHTN, Pt endorse + FM. Denies vaginal bleeding.  ? ?Subjective: ?Pt still feeling cxt. Tolerates well, support at bedside. POC reviewed. Pt had elevated BP and now meet criteria for GHTN, denies HA, RUQ pain or vision changes, labs done cmp unremarkable, PCR 0.24.  ?Patient Active Problem List  ? Diagnosis Date Noted  ? Gestational hypertension 03/08/2022  ? Normal labor and delivery 03/07/2022  ? Rh negative, antepartum 08/30/2021  ? Supervision of normal first pregnancy, antepartum 08/20/2021  ? ?Objective: ?BP (!) 143/90   Pulse 82   Temp 98.3 ?F (36.8 ?C) (Oral)   Resp 16   Ht 5\' 2"  (1.575 m)   Wt 84.4 kg   LMP 06/11/2021   BMI 34.02 kg/m?  ?No intake/output data recorded. ?No intake/output data recorded. ?NST: FHR baseline 145 bpm, Variability: moderate, Accelerations:present, Decelerations:  Absent= Cat 1/Reactive ?CTX:  irregular, every 2-4 minutes, lasting 70-80 seconds. ?Uterus gravid, soft non tender, moderate to palpate with contractions.  ?SVE:  Dilation: 2.5 ?Effacement (%): 80, 90 ?Station: -1, -2 ?Exam by:: 06/13/2021, RN ?Pitocin at 10 mUn/min ? ?Assessment:  ?Paige Sanchez is a 21 y.o. female, G1P0000, IUP at 38.3 weeks, presenting for Early labor with SROM @1330  on 3/17. RH-, rhogam given 12/27/2021. With new onset GHTN. Pt endorse + FM. Denies vaginal bleeding. Progressing on pitocin in latent labor. ?Patient Active Problem List  ? Diagnosis Date Noted  ? Gestational hypertension 03/08/2022  ? Normal labor and delivery 03/07/2022  ? Rh negative, antepartum 08/30/2021  ? Supervision of normal first pregnancy, antepartum 08/20/2021  ? ?NICHD: Category 1 ? ?Membranes:  SROM, clear @ 1330 on 3/17, no s/s of infection ? ?Induction:   ? Cytotec xN/A, pt 2cm and 80% upon admission  ? Foley Bulb: N/A pt SROM ? Pitocin  - 10 ? ?Pain management:   ?            IV pain management: x fentanyl @ 0530, 0548 ? Nitrous:  PRN ?            Epidural placement: PRN ? ?GBS Negative ? ?GHTN: BP 143/90, asymptomatic, PCR 0.24, cmp unremarkable.  ? ? ?Plan: ?Continue labor plan ?Continuous monitoring ?Rest ?Ambulate ?Frequent position changes to facilitate fetal rotation and descent. ?Will reassess with cervical exam at 4 hours or earlier if necessary ?Continue pitocin per protocol  ?Anticipate labor progression and vaginal delivery.  ? ?10/30/2021, NP-C, CNM, MSN ?03/08/2022. 6:30 AM  ?

## 2022-03-08 NOTE — Progress Notes (Signed)
Labor Progress Note ? ?Paige Sanchez is a 21 y.o. female, G1P0000, IUP at 38.3 weeks, presenting for Early labor with SROM @1330  on 3/17. RH-, rhogam given 12/27/2021.  with new onset GHTN, Pt endorse + FM. Denies vaginal bleeding.  ? ?Subjective: ?Pt comfortable after re bolus with epidural, pt was feeling a lot of pressure, and pushing involuntary. Practice push performed, cervix still 9cm r/t pt being really uncomfortable, but pt feels better now, will labor down with peanut ball and reassesses.  support at bedside, denies HA, RUQ pain or vision changes.  ?Patient Active Problem List  ? Diagnosis Date Noted  ? Gestational hypertension 03/08/2022  ? Normal labor and delivery 03/07/2022  ? Rh negative, antepartum 08/30/2021  ? Supervision of normal first pregnancy, antepartum 08/20/2021  ? ?Objective: ?BP (!) 149/72   Pulse (!) 115   Temp 98.1 ?F (36.7 ?C) (Oral)   Resp 18   Ht 5\' 2"  (1.575 m)   Wt 84.4 kg   LMP 06/11/2021   SpO2 100%   BMI 34.02 kg/m?  ?No intake/output data recorded. ?No intake/output data recorded. ?NST: FHR baseline 145 bpm, Variability: moderate, Accelerations:present, Decelerations:  Absent= Cat 1/Reactive ?CTX:  irregular, every 2-3 minutes, lasting 70-80 seconds. ?Uterus gravid, soft non tender, moderate to palpate with contractions.  ?SVE:  Dilation: 9 ?Effacement (%): 100 ?Station: Plus 1 ?Exam by:: [facility] CNM ?Pitocin at 12 decreased to 8 r/t tachysytole mUn/min ? ?Assessment:  ?Paige Sanchez is a 21 y.o. female, G1P0000, IUP at 38.3 weeks, presenting for Early labor with SROM @1330  on 3/17. RH-, rhogam given 12/27/2021. With new onset GHTN. Pt endorse + FM. Denies vaginal bleeding. Progressing on pitocin in active labor. ?Patient Active Problem List  ? Diagnosis Date Noted  ? Gestational hypertension 03/08/2022  ? Normal labor and delivery 03/07/2022  ? Rh negative, antepartum 08/30/2021  ? Supervision of normal first pregnancy, antepartum 08/20/2021  ? ?NICHD: Category  1 ? ?Membranes:  SROM, clear @ 1330 on 3/17, no s/s of infection ? ?Induction:   ? Cytotec xN/A, pt 2cm and 80% upon admission  ? Foley Bulb: N/A pt SROM ? Pitocin - 12 reduced to 8 r/t tachysytole  ? ?Pain management:   ?            IV pain management: x fentanyl @ 0530, 0548 ? Nitrous:  PRN ?            Epidural placement: Placed at 3/18 @ 0815 ? ?GBS Negative ? ?GHTN: BP 149/72, asymptomatic, PCR 0.24, cmp unremarkable.  ? ? ?Plan: ?Continue labor plan ?Continuous monitoring ?Rest ?Frequent position changes to facilitate fetal rotation and descent. ?Will reassess with cervical exam at 4 hours or earlier if necessary ?Continue pitocin per protocol  ?Anticipate labor progression and vaginal delivery.  ? ?Noralyn Pick, NP-C, CNM, MSN ?03/08/2022. 6:30 AM  ?

## 2022-03-08 NOTE — Progress Notes (Signed)
Labor Progress Note ? ?Paige Sanchez is a 21 y.o. female, G1P0000, IUP at 38.3 weeks, presenting for Early labor with SROM @1330  on 3/17. RH-, rhogam given 12/27/2021. Pt endorse + FM. Denies vaginal bleeding.  ? ?Subjective: ?Pt still feeling cxt. Tolerates well, support at bedside. POC reviewed. Pt had elevated BP and now meet criteria for GHTN, denies HA, RUQ pain or vision changes, labs done cmp unremarkable, PCR 0.24.  ?Patient Active Problem List  ? Diagnosis Date Noted  ? Normal labor and delivery 03/07/2022  ? Rh negative, antepartum 08/30/2021  ? Supervision of normal first pregnancy, antepartum 08/20/2021  ? ?Objective: ?BP (!) 143/90   Pulse 82   Temp 98.3 ?F (36.8 ?C) (Oral)   Resp 16   Ht 5\' 2"  (1.575 m)   Wt 84.4 kg   LMP 06/11/2021   BMI 34.02 kg/m?  ?No intake/output data recorded. ?No intake/output data recorded. ?NST: FHR baseline 145 bpm, Variability: moderate, Accelerations:present, Decelerations:  Absent= Cat 1/Reactive ?CTX:  irregular, every 2-4 minutes, lasting 70-80 seconds. ?Uterus gravid, soft non tender, moderate to palpate with contractions.  ?SVE:  Dilation: 2.5 ?Effacement (%): 80, 90 ?Station: -1, -2 ?Exam by:: Lupita Shutter, RN ?Pitocin at 2 mUn/min ? ?Assessment:  ?Paige Sanchez is a 21 y.o. female, G1P0000, IUP at 38.3 weeks, presenting for Early labor with SROM @1330  on 3/17. RH-, rhogam given 12/27/2021. Pt endorse + FM. Denies vaginal bleeding. Progressing on pitocin in early labor.  ?Patient Active Problem List  ? Diagnosis Date Noted  ? Normal labor and delivery 03/07/2022  ? Rh negative, antepartum 08/30/2021  ? Supervision of normal first pregnancy, antepartum 08/20/2021  ? ?NICHD: Category 1 ? ?Membranes:  SROM, clear @ 1330 on 3/17, no s/s of infection ? ?Induction:   ? Cytotec xN/A, pt 2cm and 80% upon admission  ? Foley Bulb: N/A pt SROM ? Pitocin - 2 ? ?Pain management:   ?            IV pain management: x PRN fentanyl ? Nitrous:  PRN ?            Epidural placement:  PRN ? ?GBS Negative ? ? ?Plan: ?Continue labor plan ?Continuous monitoring ?Rest ?Ambulate ?Frequent position changes to facilitate fetal rotation and descent. ?Will reassess with cervical exam at 4 hours or earlier if necessary ?Continue pitocin per protocol  ?Anticipate labor progression and vaginal delivery.  ? ?Noralyn Pick, NP-C, CNM, MSN ?03/08/2022. 1:30 AM  ?

## 2022-03-08 NOTE — Lactation Note (Signed)
This note was copied from a baby's chart. ?Lactation Consultation Note ? ?Patient Name: Paige Sanchez ?Today's Date: 03/08/2022 ?Reason for consult: L&D Initial assessment;Mother's request;Primapara;1st time breastfeeding;Exclusive pumping and bottle feeding;Early term 37-38.6wks;Breastfeeding assistance ?Age:23 hours ? ?Mom feeding plan to pump and offer her EBM in a bottle. In L and D able to hand express and feed infant 4 ml on spoon.  ?Mom aware to feed by cues and can offer more colostrum on spoon if he shows interest.  ? ?Mom states may try latching later in the room. Mom brought in her personal electric pump to use. Mom to pump DEBP q 3hrs for 15 min.  ?LC to provide further support on the floor.  ? ?Maternal Data ?Has patient been taught Hand Expression?: Yes ?Does the patient have breastfeeding experience prior to this delivery?: No ? ?Feeding ?Mother's Current Feeding Choice: Breast Milk ? ?LATCH Score ?  ? ?  ? ?  ? ?  ? ?  ? ?  ? ? ?Lactation Tools Discussed/Used ?  ? ?Interventions ?Interventions: Breast feeding basics reviewed;Hand express;Expressed milk;Infant Driven Feeding Algorithm education ? ?Discharge ?  ? ?Consult Status ?Consult Status: Follow-up from L&D ?Date: 03/09/22 ?Follow-up type: In-patient ? ? ? ?Seleni Meller  Nicholson-Springer ?03/08/2022, 4:46 PM ? ? ? ?

## 2022-03-09 LAB — COMPREHENSIVE METABOLIC PANEL
ALT: 14 U/L (ref 0–44)
AST: 36 U/L (ref 15–41)
Albumin: 2.7 g/dL — ABNORMAL LOW (ref 3.5–5.0)
Alkaline Phosphatase: 121 U/L (ref 38–126)
Anion gap: 10 (ref 5–15)
BUN: <5 mg/dL — ABNORMAL LOW (ref 6–20)
CO2: 19 mmol/L — ABNORMAL LOW (ref 22–32)
Calcium: 8.7 mg/dL — ABNORMAL LOW (ref 8.9–10.3)
Chloride: 110 mmol/L (ref 98–111)
Creatinine, Ser: 0.65 mg/dL (ref 0.44–1.00)
GFR, Estimated: >60 mL/min (ref >60)
Glucose, Bld: 102 mg/dL — ABNORMAL HIGH (ref 70–99)
Potassium: 3.5 mmol/L (ref 3.5–5.1)
Sodium: 139 mmol/L (ref 135–145)
Total Bilirubin: 0.7 mg/dL (ref 0.3–1.2)
Total Protein: 5.7 g/dL — ABNORMAL LOW (ref 6.5–8.1)

## 2022-03-09 LAB — CBC
HCT: 31.9 % — ABNORMAL LOW (ref 36.0–46.0)
Hemoglobin: 10.6 g/dL — ABNORMAL LOW (ref 12.0–15.0)
MCH: 28.8 pg (ref 26.0–34.0)
MCHC: 33.2 g/dL (ref 30.0–36.0)
MCV: 86.7 fL (ref 80.0–100.0)
Platelets: 219 10*3/uL (ref 150–400)
RBC: 3.68 MIL/uL — ABNORMAL LOW (ref 3.87–5.11)
RDW: 14.9 % (ref 11.5–15.5)
WBC: 19.8 10*3/uL — ABNORMAL HIGH (ref 4.0–10.5)
nRBC: 0 % (ref 0.0–0.2)

## 2022-03-09 NOTE — Progress Notes (Signed)
Rhogam 300 mcg administered 03/09/22 @ 1105.  ?Rhea Pink, MSN, CNM ?03/09/2022 3:58 PM ? ?

## 2022-03-09 NOTE — Anesthesia Postprocedure Evaluation (Signed)
Anesthesia Post Note ? ?Patient: Paige Sanchez ? ?Procedure(s) Performed: AN AD HOC LABOR EPIDURAL ? ?  ? ?Patient location during evaluation: Mother Baby ?Anesthesia Type: Epidural ?Level of consciousness: awake and alert and oriented ?Pain management: satisfactory to patient ?Vital Signs Assessment: post-procedure vital signs reviewed and stable ?Respiratory status: respiratory function stable ?Cardiovascular status: stable ?Postop Assessment: no headache, no backache, epidural receding, patient able to bend at knees, no signs of nausea or vomiting, adequate PO intake and able to ambulate ?Anesthetic complications: no ? ? ?No notable events documented. ? ?Last Vitals:  ?Vitals:  ? 03/09/22 0508 03/09/22 0850  ?BP: 130/71 134/83  ?Pulse: 95 98  ?Resp: 18 17  ?Temp: 36.9 ?C 36.6 ?C  ?SpO2:  100%  ?  ?Last Pain:  ?Vitals:  ? 03/09/22 0850  ?TempSrc: Oral  ?PainSc:   ? ?Pain Goal: Patients Stated Pain Goal: 0 (03/08/22 1306) ? ?  ?  ?  ?  ?  ?  ?  ? ?Kynadee Dam ? ? ? ? ?

## 2022-03-09 NOTE — Lactation Note (Signed)
This note was copied from a baby's chart. ?Lactation Consultation Note ? ?Patient Name: Paige Sanchez ?Today's Date: 03/09/2022 ?  ?Age:23 hours ?Per RN, Mom declined [place] services tonight.  ?Maternal Data ?  ? ?Feeding ?Nipple Type: Slow - flow ? ?LATCH Score ?  ? ?  ? ?  ? ?  ? ?  ? ?  ? ? ?Lactation Tools Discussed/Used ?  ? ?Interventions ?  ? ?Discharge ?  ? ?Consult Status ?  ? ? ? ?Vicente Serene ?03/09/2022, 12:15 AM ? ? ? ?

## 2022-03-09 NOTE — Progress Notes (Signed)
CSW received and acknowledges consult for EDPS of 9.  Consult screened out due to 9 on EDPS does not warrant a CSW consult.  MOB whom scores are greater than 9/yes to question 10 on Edinburgh Postpartum Depression Screen warrants a CSW consult.   Aleena Kirkeby Boyd-Gilyard, MSW, LCSW Clinical Social Work [phone]  

## 2022-03-09 NOTE — Progress Notes (Signed)
PPD# 1 SVD w/ 2nd degree laceration ?Information for the patient's newborn:  Paige Sanchez, Paige Sanchez [[id number]]  ?female   ?Name: Paige Sanchez ?Circumcision requests an in-patient circ ? ? ?S:   ?Reports feeling good ?Tolerating PO fluid and solids ?No nausea or vomiting ?Bleeding is moderate, no clots, advised voiding every 3 hours ?Pain controlled with  PO meds ?Up ad lib / ambulatory / voiding w/o difficulty ?Feeding: Breast  ? ? ?O:   ?VS: BP 134/83 (BP Location: Left Arm)   Pulse 98   Temp 97.8 ?F (36.6 ?C) (Oral)   Resp 17   Ht 5\' 2"  (1.575 m)   Wt 84.4 kg   LMP 06/11/2021   SpO2 100%   Breastfeeding Unknown   BMI 34.02 kg/m?  ? ?LABS:  ?Recent Labs  ?  03/07/22 ?1615 03/09/22 ?03/11/22  ?WBC 10.9* 19.8*  ?HGB 11.6* 10.6*  ?PLT 237 219  ? ?Blood type: --/--/O NEG (03/17 1615) ?Rubella: Immune (10/10 0000)       ?             ?  ?I&O: Intake/Output   ?   03/18 0701 ?03/19 0700 03/19 0701 ?03/20 0700  ? Urine (mL/kg/hr) 2000 (1)   ? Blood 116   ? Total Output 2116   ? Net -2116   ?     ?  ? ?Physical Exam: ?Alert and oriented X3 ?Lungs: Clear and unlabored ?Heart: regular rate and rhythm / no mumurs ?Abdomen: soft, non-tender, non-distended  ?Fundus: firm, non-tender, U-2 ?Perineum: well-approximated, healing ?Lochia: appropriate ?Extremities: trace edema, no calf pain, tenderness, or cords ?  ? ?A:  ?PPD # 1  ?Normal PP exam ?GHTN ?   -mild range BP ? ?P:  ?Routine post partum orders ?Continue Procardia XL 30 mg PO daily ?Anticipate D/C on 03/10/22 ? ?Plan reviewed w/ Dr. 03/12/22 ? ?Su Hilt, MSN, CNM ?03/09/2022, 10:05 AM ? ?

## 2022-03-09 NOTE — Lactation Note (Signed)
This note was copied from a baby's chart. ?Lactation Consultation Note ? ?Patient Name: Paige Sanchez ?Today's Date: 03/09/2022 ?Reason for consult: Initial assessment;Primapara;Early term 37-38.6wks;Exclusive pumping and bottle feeding ?Age:23 hours ? ? ?P1 mother whose infant is now 60 hours old.  This is an ETI at 38+4 weeks.  Mother's current feeding preference is to pump and bottle feed her EBM.  She is using formula until her milk comes to volume.   ? ?Baby "Paige Sanchez" was swaddled and asleep when I arrived.  Parents had no questions regarding bottle feeding.  He is using the yellow slow flow nipple and feeding well.  Provided supplementation guidelines.  "Paige Sanchez" has had 3 stools; no voids yet. ? ?Mother prefers to use her own person DEBP; she has not yet started pumping.  Education completed on the importance of beginning to pump as soon as possible and to pump every three hours.  Encouraged breast massage and hand expression before/after pumping to help ensure a good milk supply.  Mother familiar with hand expression.  She is to call for any questions once she begins pumping this morning.  Father present. ? ? ?Maternal Data ?Has patient been taught Hand Expression?: Yes ?Does the patient have breastfeeding experience prior to this delivery?: No ? ?Feeding ?Mother's Current Feeding Choice: Breast Milk and Formula ? ?LATCH Score ?  ? ?  ? ?  ? ?  ? ?  ? ?  ? ? ?Lactation Tools Discussed/Used ?  ? ?Interventions ?Interventions: Education;LC Services brochure ? ?Discharge ?Pump: Personal ? ?Consult Status ?Consult Status: Follow-up ?Date: 03/10/22 ?Follow-up type: In-patient ? ? ? ?Ceirra Belli R Chasty Randal ?03/09/2022, 8:43 AM ? ? ? ?

## 2022-03-10 ENCOUNTER — Other Ambulatory Visit (HOSPITAL_COMMUNITY): Payer: Self-pay

## 2022-03-10 LAB — RH IG WORKUP (INCLUDES ABO/RH)
Fetal Screen: NEGATIVE
Gestational Age(Wks): 38.4
Unit division: 0

## 2022-03-10 MED ORDER — IBUPROFEN 600 MG PO TABS
600.0000 mg | ORAL_TABLET | Freq: Four times a day (QID) | ORAL | 3 refills | Status: DC | PRN
Start: 2022-03-10 — End: 2024-06-17
  Filled 2022-03-10: qty 60, 15d supply, fill #0

## 2022-03-10 MED ORDER — NIFEDIPINE ER 30 MG PO TB24
30.0000 mg | ORAL_TABLET | Freq: Every day | ORAL | 3 refills | Status: AC
Start: 2022-03-11 — End: ?
  Filled 2022-03-10: qty 30, 30d supply, fill #0

## 2022-03-10 NOTE — Lactation Note (Signed)
This note was copied from a baby's chart. ?Lactation Consultation Note ? ?Patient Name: Paige Sanchez ?Today's Date: 03/10/2022 ?Reason for consult: Follow-up assessment;Exclusive pumping and bottle feeding ?Age:23 hours ? ?Reviewed the importance of pumping 8 times per day to help establish mother' milk supply. ?Reviewed engorgement care and monitoring voids/stools. ? ? ?Feeding ?Mother's Current Feeding Choice: Breast Milk and Formula ?Nipple Type: Slow - flow ? ? ?Lactation Tools Discussed/Used ?Tools: Pump ?Breast pump type: Double-Electric Breast Pump ?Reason for Pumping: mother's choice ?Pumping frequency: Recommend q 3 hours ? ?Interventions ?Interventions: DEBP;Education ? ?Discharge ?Discharge Education: Engorgement and breast care;Warning signs for feeding baby ?Pump: Hands Free (Motif) ? ?Consult Status ?Consult Status: Complete ?Date: 03/10/22 ? ? ? ?[name] ?03/10/2022, 9:22 AM ? ? ? ?

## 2022-03-10 NOTE — Discharge Summary (Addendum)
?[facility] Discharge Summary ? ? ?Patient Name:   Paige Sanchez ?DOB:     02/12/2001 ?MRN:     [id number] ? ?Date of Admission:   03/07/2022 ?Date of Discharge:  03/10/2022 ? ?Admitting diagnosis:   ? ?Normal labor and delivery [O80] ?Principal Problem: ?  Normal postpartum course ?Active Problems: ?  Rh negative, delivered, current hospitalization ?  Normal labor and delivery ?  Gestational hypertension ?  SVD (spontaneous vaginal delivery) ?PROM ?   ?Discharge diagnosis:   ? ?Normal labor and delivery [O80] ?Principal Problem: ?  Normal postpartum course ?Active Problems: ?  Rh negative, delivered, current hospitalization ?  Normal labor and delivery ?  Gestational hypertension ?  SVD (spontaneous vaginal delivery) ? ?Term Pregnancy Delivered and Gestational Hypertension       ? ?Additional problems: None  ?                                        ?Post partum procedures:rhogam ?Augmentation: Pitocin ?Complications: None ? ?Hospital course: Onset of Labor With Vaginal Delivery      ?21 y.o. yo G1P1001 at 67w4dwas admitted in Latent Labor on 03/07/2022. Patient had an uncomplicated labor course as follows:  ?Membrane Rupture Time/Date: 1:30 PM ,03/07/2022   ?Delivery Method:Vaginal, Spontaneous  ?Episiotomy: None  ?Lacerations:  2nd degree  ?Patient had an uncomplicated postpartum course.  She is ambulating, tolerating a regular diet, passing flatus, and urinating well. Patient is discharged home in stable condition on 03/10/22. ? ?Newborn Data: ?Birth date:03/08/2022  ?Birth time:4:02 PM  ?Gender:Female  ?Living status:Living  ?Apgars:9 ,10  ?Weight:3360 g  ? ?Magnesium Sulfate received: No ?BMZ received: No ?Rhophylac:Yes ?MMR:No ?T-DaP:Given prenatally ?Flu: No ?Transfusion:No ?                                                              ?Type of Delivery:  NSVD ?Delivering Provider: MDuanne Limerick JADE  ?Date of Delivery:  03/08/22 ? ?Newborn Data:  ?Baby Feeding:   Breast ?Disposition:   home with  mother ? ?Physical Exam: ?  ?Vitals:  ? 03/09/22 2100 03/10/22 0230 03/10/22 0726203/20/23 0849  ?BP: (!) 146/79 128/76 130/84 127/70  ?Pulse: 91 77 82 68  ?Resp: _0 ?Temp: 98.8 ?F (37.1 ?C) 98 ?F (36.7 ?C) 98.5 ?F (36.9 ?C) 98.5 ?F (36.9 ?C)  ?TempSrc: Oral Oral Oral Oral  ?SpO2: 100% 100% 100% 100%  ?Weight:      ?Height:      ? ?General: alert, cooperative, and no distress ?Lochia: appropriate ?Uterine Fundus: firm ?Incision: N/A ?DVT Evaluation: No evidence of DVT seen on physical exam. ?Negative Homan's sign. ?No cords or calf tenderness. ? ?Labs: ?Lab Results  ?Component Value Date  ? WBC 19.8 (H) 03/09/2022  ? HGB 10.6 (L) 03/09/2022  ? HCT 31.9 (L) 03/09/2022  ? MCV 86.7 03/09/2022  ? PLT 219 03/09/2022  ? ?CMP Latest Ref Rng & Units 03/09/2022  ?Glucose 70 - 99 mg/dL 102(H)  ?BUN 6 - 20 mg/dL <5(L)  ?Creatinine 0.44 - 1.00 mg/dL 0.65  ?Sodium 135 - 145 mmol/L 139  ?Potassium 3.5 - 5.1 mmol/L 3.5  ?Chloride 98 - 111  mmol/L 110  ?CO2 22 - 32 mmol/L 19(L)  ?Calcium 8.9 - 10.3 mg/dL 8.7(L)  ?Total Protein 6.5 - 8.1 g/dL 5.7(L)  ?Total Bilirubin 0.3 - 1.2 mg/dL 0.7  ?Alkaline Phos 38 - 126 U/L 121  ?AST 15 - 41 U/L 36  ?ALT 0 - 44 U/L 14  ? ? ?Discharge instruction: per After Visit Summary and "Baby and Me Booklet". ? ?After Visit Meds:  ?Allergies as of 03/10/2022   ?No Known Allergies ?  ? ?  ?Medication List  ?  ? ?TAKE these medications   ? ?calcium-vitamin D 500-5 MG-MCG tablet ?Commonly known as: OSCAL WITH D ?Take 1 tablet by mouth. ?  ?ferrous sulfate 325 (65 FE) MG tablet ?Take 325 mg by mouth daily with breakfast. ?  ?ibuprofen 600 MG tablet ?Commonly known as: ADVIL ?Take 1 tablet (600 mg total) by mouth every 6 (six) hours as needed for moderate pain or cramping. ?  ?NIFEdipine 30 MG 24 hr tablet ?Commonly known as: ADALAT CC ?Take 1 tablet (30 mg total) by mouth daily. ?Start taking on: March 11, 2022 ?  ?prenatal multivitamin Tabs tablet ?Take 1 tablet by mouth daily at 12 noon. ?  ? ?   ? ? ?Diet: low salt diet ? ?Activity: Advance as tolerated. Pelvic rest for 6 weeks.  ? ?Outpatient follow up:1 week ?Follow up Appt:No future appointments. ?Follow up visit: No follow-ups on file. ? ?Postpartum contraception: Not Discussed ? ?03/10/2022 ?Sanjuana Kava, MD ? ?  ?

## 2022-03-16 ENCOUNTER — Inpatient Hospital Stay (HOSPITAL_COMMUNITY)
Admission: AD | Admit: 2022-03-16 | Payer: BC Managed Care – PPO | Source: Home / Self Care | Admitting: Obstetrics and Gynecology

## 2022-03-18 ENCOUNTER — Telehealth (HOSPITAL_COMMUNITY): Payer: Self-pay

## 2022-03-18 NOTE — Telephone Encounter (Signed)
"  Doing great. Just adjusting and getting settled into a routine. I've been healing up fine." Patient declines any questions or concerns about her healing.  ? ?"He's eating well on a regular schedule. We are combo feeding, but he is getting mostly breastmilk. He has gained 4 ounces already. We have been to the doctor a few times for weight checks. He sleeps in a bassinet next to my bed." RN reviewed ABC's of safe sleep with patient. Patient declines any questions or concerns about baby. ? ?EPDS score is 3. ? ?Sharyn Lull Rogan Ecklund,RN3,MSN,RNC-MNN ?03/18/2022,1223 ?

## 2024-06-17 ENCOUNTER — Ambulatory Visit (INDEPENDENT_AMBULATORY_CARE_PROVIDER_SITE_OTHER)

## 2024-06-17 ENCOUNTER — Ambulatory Visit (HOSPITAL_COMMUNITY)
Admission: EM | Admit: 2024-06-17 | Discharge: 2024-06-17 | Disposition: A | Discharge status: Home / Self Care | Attending: Nurse Practitioner | Admitting: Nurse Practitioner

## 2024-06-17 ENCOUNTER — Encounter (HOSPITAL_COMMUNITY): Payer: Self-pay | Admitting: *Deleted

## 2024-06-17 DIAGNOSIS — S60022A Contusion of left index finger without damage to nail, initial encounter: Secondary | ICD-10-CM

## 2024-06-17 MED ORDER — IBUPROFEN 800 MG PO TABS
ORAL_TABLET | ORAL | Status: AC
  Filled 2024-06-17: qty 1

## 2024-06-17 MED ORDER — IBUPROFEN 800 MG PO TABS: 800.0000 mg | ORAL_TABLET | Freq: Three times a day (TID) | ORAL | 0 refills | Status: AC | PRN

## 2024-06-17 MED ORDER — IBUPROFEN 800 MG PO TABS
800.0000 mg | ORAL_TABLET | Freq: Once | ORAL | Status: AC
  Administered 2024-06-17: 800 mg via ORAL

## 2024-06-17 NOTE — Discharge Instructions (Addendum)
 You were seen today for pain and swelling in your left index finger after accidentally closing it in a car door. X-rays did not show any broken bones, and your exam confirmed that the injury is a contusion, or bruise, to the soft tissue of the finger.  A finger splint was applied to protect the area and limit movement while it heals. You were given ibuprofen  800 mg in clinic and prescribed the same dose to take by mouth up to three times a day as needed for pain. Do not take any additional over-the-counter NSAIDs like Advil  or Aleve while on this medication. You may take Tylenol  if more pain relief is needed.  To reduce swelling and discomfort, apply ice to the finger for 20 minutes two to three times a day, using a towel between the ice and your skin to prevent injury. Keep your hand elevated when possible, and avoid using the injured finger for heavy tasks until the pain and swelling improve.  Most finger contusions improve with time and rest. Follow up with your primary care provider if the pain does not improve within a few days, or if you develop stiffness, numbness, or difficulty moving the finger. Go to the emergency department if you notice worsening swelling, redness, warmth, fever, drainage, or if you suddenly cannot move the finger.

## 2024-06-17 NOTE — ED Triage Notes (Signed)
 Pt states that she slammed her left pointer finger in her car door today and came straight here.

## 2024-06-17 NOTE — ED Provider Notes (Signed)
 [facility]    CSN: [id number] Arrival date & time: 06/17/24  1237      History   Chief Complaint Chief Complaint  Patient presents with   Finger Injury    HPI Paige Sanchez is a 23 y.o. female.   Discussed the use of AI scribe software for clinical note transcription with the patient, who gave verbal consent to proceed.   Paige Sanchez is a 23 y.o. female presents to urgent care today with pain in her left index finger following an injury. The patient reports that the injury occurred earlier today. She accidentally closed her finger in the car door. The patient describes the pain as initially numb but now rates it as a 10 out of 10 in severity. She indicates that the pain is localized to the bottom portion of the left index finger. She has not taken any pain medication prior to her visit.   The following portions of the patient's history were reviewed and updated as appropriate: allergies, current medications, past family history, past medical history, past social history, past surgical history, and problem list.      Past Medical History:  Diagnosis Date   Medical history non-contributory     Patient Active Problem List   Diagnosis Date Noted   Gestational hypertension 03/08/2022   SVD (spontaneous vaginal delivery) 03/08/2022   Normal postpartum course 03/08/2022   Normal labor and delivery 03/07/2022   Rh negative, delivered, current hospitalization 08/30/2021   Supervision of normal first pregnancy, antepartum 08/20/2021    Past Surgical History:  Procedure Laterality Date   NO PAST SURGERIES      OB History     Gravida  1   Para  1   Term  1   Preterm      AB      Living  1      SAB      IAB      Ectopic      Multiple  0   Live Births  1            Home Medications    Prior to Admission medications   Medication Sig Start Date End Date Taking? Authorizing Provider  ibuprofen  (ADVIL ) 800 MG tablet Take 1 tablet (800  mg total) by mouth every 8 (eight) hours as needed (pain). 06/17/24  Yes Iola Lukes, FNP  calcium-vitamin D (OSCAL WITH D) 500-5 MG-MCG tablet Take 1 tablet by mouth.    [provider]  ferrous sulfate 325 (65 FE) MG tablet Take 325 mg by mouth daily with breakfast.    [provider]  NIFEdipine  (ADALAT  CC) 30 MG 24 hr tablet Take 1 tablet (30 mg total) by mouth daily. 03/11/22   Pinn, Walda, MD  Prenatal Vit-Fe Fumarate-FA (PRENATAL MULTIVITAMIN) TABS tablet Take 1 tablet by mouth daily at 12 noon.    [provider]    Family History Family History  Problem Relation Age of Onset   Hypertension Father    Diabetes Paternal Grandmother     Social History Social History   Tobacco Use   Smoking status: Never    Passive exposure: Never   Smokeless tobacco: Never  Vaping Use   Vaping status: Never Used  Substance Use Topics   Alcohol use: No    Comment: social   Drug use: No     Allergies   Patient has no known allergies.   Review of Systems Review of Systems  Musculoskeletal:  Positive  for arthralgias and joint swelling.  Neurological:  Positive for numbness. Negative for weakness.  All other systems reviewed and are negative.    Physical Exam Triage Vital Signs ED Triage Vitals  Encounter Vitals Group     BP 06/17/24 1322 135/82     Girls Systolic BP Percentile --      Girls Diastolic BP Percentile --      Boys Systolic BP Percentile --      Boys Diastolic BP Percentile --      Pulse Rate 06/17/24 1322 69     Resp 06/17/24 1322 14     Temp 06/17/24 1322 98.5 F (36.9 C)     Temp Source 06/17/24 1322 Oral     SpO2 06/17/24 1322 98 %     Weight --      Height --      Head Circumference --      Peak Flow --      Pain Score 06/17/24 1321 8     Pain Loc --      Pain Education --      Exclude from Growth Chart --    No data found.  Updated Vital Signs BP 135/82 (BP Location: Right Arm)   Pulse 69   Temp 98.5 F (36.9 C)  (Oral)   Resp 14   LMP 06/08/2024 (Approximate)   SpO2 98%   Visual Acuity Right Eye Distance:   Left Eye Distance:   Bilateral Distance:    Right Eye Near:   Left Eye Near:    Bilateral Near:     Physical Exam Vitals reviewed.  Constitutional:      General: She is not in acute distress.    Appearance: Normal appearance. She is not toxic-appearing.  HENT:     Head: Normocephalic.     Mouth/Throat:     Mouth: Mucous membranes are moist.   Eyes:     Conjunctiva/sclera: Conjunctivae normal.    Cardiovascular:     Rate and Rhythm: Normal rate and regular rhythm.     Heart sounds: Normal heart sounds.  Pulmonary:     Effort: Pulmonary effort is normal.     Breath sounds: Normal breath sounds.   Musculoskeletal:     Left hand: Swelling and tenderness present. No deformity, lacerations or bony tenderness. Decreased range of motion. Normal strength. Normal sensation. Normal capillary refill.       Hands:   Skin:    General: Skin is warm and dry.   Neurological:     General: No focal deficit present.     Mental Status: She is alert and oriented to person, place, and time.     Sensory: Sensation is intact. No sensory deficit.     Motor: Motor function is intact. No weakness.      UC Treatments / Results  Labs (all labs ordered are listed, but only abnormal results are displayed) Labs Reviewed - No data to display  EKG   Radiology DG Finger Index Left Result Date: 06/17/2024 CLINICAL DATA:  Car door injury of index finger. EXAM: LEFT INDEX FINGER 2+V COMPARISON:  None Available. FINDINGS: There is no evidence of fracture or dislocation. There is no evidence of arthropathy or other focal bone abnormality. Soft tissues are unremarkable. IMPRESSION: Negative. Electronically Signed   By: Lynwood Landy Raddle M.D.   On: 06/17/2024 14:01    Procedures Procedures (including critical care time)  Medications Ordered in UC Medications  ibuprofen  (ADVIL ) tablet 800 mg (800  mg Oral Given 06/17/24 1416)    Initial Impression / Assessment and Plan / UC Course  I have reviewed the triage vital signs and the nursing notes.  Pertinent labs & imaging results that were available during my care of the patient were reviewed by me and considered in my medical decision making (see chart for details).     Patient presents with left index finger pain and swelling after closing a car door on it. Pain is rated 10/10 and localized to the area of injury. X-ray of the finger was negative for fracture. Physical exam reveals soft tissue swelling without deformity, and the patient retains full extension of the finger. Findings are consistent with a contusion of the left index finger. A finger splint was applied, and ibuprofen  800 mg was administered in clinic. A prescription for ibuprofen  800 mg three times daily as needed for pain was provided, with instructions not to take additional over-the-counter NSAIDs while using this medication. Tylenol  may be used if additional pain relief is needed. Patient was advised to ice the area for 20 minutes two to three times daily, using a towel to protect the skin. Follow-up is recommended if symptoms worsen, fail to improve, or if new signs of infection or limited motion develop. Seek urgent care or emergency evaluation for increasing pain, redness, numbness, or inability to move the finger.   Today's evaluation has revealed no signs of a dangerous process. Discussed diagnosis with patient and/or guardian. Patient and/or guardian aware of their diagnosis, possible red flag symptoms to watch out for and need for close follow up. Patient and/or guardian understands verbal and written discharge instructions. Patient and/or guardian comfortable with plan and disposition.  Patient and/or guardian has a clear mental status at this time, good insight into illness (after discussion and teaching) and has clear judgment to make decisions regarding their  care  Documentation was completed with the aid of voice recognition software. Transcription may contain typographical errors. Final Clinical Impressions(s) / UC Diagnoses   Final diagnoses:  Contusion of left index finger without damage to nail, initial encounter     Discharge Instructions      You were seen today for pain and swelling in your left index finger after accidentally closing it in a car door. X-rays did not show any broken bones, and your exam confirmed that the injury is a contusion, or bruise, to the soft tissue of the finger.  A finger splint was applied to protect the area and limit movement while it heals. You were given ibuprofen  800 mg in clinic and prescribed the same dose to take by mouth up to three times a day as needed for pain. Do not take any additional over-the-counter NSAIDs like Advil  or Aleve while on this medication. You may take Tylenol  if more pain relief is needed.  To reduce swelling and discomfort, apply ice to the finger for 20 minutes two to three times a day, using a towel between the ice and your skin to prevent injury. Keep your hand elevated when possible, and avoid using the injured finger for heavy tasks until the pain and swelling improve.  Most finger contusions improve with time and rest. Follow up with your primary care provider if the pain does not improve within a few days, or if you develop stiffness, numbness, or difficulty moving the finger. Go to the emergency department if you notice worsening swelling, redness, warmth, fever, drainage, or if you suddenly cannot move the finger.  ED Prescriptions     Medication Sig Dispense Auth. Provider   ibuprofen  (ADVIL ) 800 MG tablet Take 1 tablet (800 mg total) by mouth every 8 (eight) hours as needed (pain). 21 tablet Iola Lukes, FNP      PDMP not reviewed this encounter.   Iola Lukes, [place] 06/17/24 1431
